# Patient Record
Sex: Female | Born: 1975 | Race: Black or African American | Hispanic: No | Marital: Single | Smoking: Former smoker
Health system: Southern US, Community
[De-identification: ages and names within clinical notes are randomized; demographics above are authoritative.]

## PROBLEM LIST (undated history)

## (undated) DIAGNOSIS — R519 Headache, unspecified: Secondary | ICD-10-CM

## (undated) DIAGNOSIS — B977 Papillomavirus as the cause of diseases classified elsewhere: Secondary | ICD-10-CM

## (undated) DIAGNOSIS — J309 Allergic rhinitis, unspecified: Secondary | ICD-10-CM

## (undated) DIAGNOSIS — B1920 Unspecified viral hepatitis C without hepatic coma: Secondary | ICD-10-CM

## (undated) DIAGNOSIS — E7800 Pure hypercholesterolemia, unspecified: Secondary | ICD-10-CM

## (undated) DIAGNOSIS — R51 Headache: Secondary | ICD-10-CM

## (undated) HISTORY — DX: Headache, unspecified: R51.9

## (undated) HISTORY — DX: Headache: R51

## (undated) HISTORY — DX: Pure hypercholesterolemia, unspecified: E78.00

## (undated) HISTORY — DX: Papillomavirus as the cause of diseases classified elsewhere: B97.7

## (undated) HISTORY — DX: Unspecified viral hepatitis C without hepatic coma: B19.20

## (undated) HISTORY — DX: Allergic rhinitis, unspecified: J30.9

---
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
## 1997-09-16 ENCOUNTER — Emergency Department (HOSPITAL_COMMUNITY): Admission: EM | Admit: 1997-09-16 | Discharge: 1997-09-16 | Payer: Self-pay | Admitting: Emergency Medicine

## 1999-05-05 ENCOUNTER — Other Ambulatory Visit: Admission: RE | Admit: 1999-05-05 | Discharge: 1999-05-05 | Payer: Self-pay | Admitting: Obstetrics and Gynecology

## 1999-06-20 ENCOUNTER — Inpatient Hospital Stay (HOSPITAL_COMMUNITY): Admission: AD | Admit: 1999-06-20 | Discharge: 1999-06-20 | Payer: Self-pay | Admitting: *Deleted

## 1999-10-31 ENCOUNTER — Inpatient Hospital Stay (HOSPITAL_COMMUNITY): Admission: AD | Admit: 1999-10-31 | Discharge: 1999-11-02 | Payer: Self-pay | Admitting: *Deleted

## 2004-01-28 ENCOUNTER — Other Ambulatory Visit: Admission: RE | Admit: 2004-01-28 | Discharge: 2004-01-28 | Payer: Self-pay | Admitting: Family Medicine

## 2004-07-13 ENCOUNTER — Inpatient Hospital Stay (HOSPITAL_COMMUNITY): Admission: AD | Admit: 2004-07-13 | Discharge: 2004-07-13 | Payer: Self-pay | Admitting: Obstetrics & Gynecology

## 2004-08-10 ENCOUNTER — Other Ambulatory Visit: Admission: RE | Admit: 2004-08-10 | Discharge: 2004-08-10 | Payer: Self-pay | Admitting: Obstetrics and Gynecology

## 2005-03-09 ENCOUNTER — Inpatient Hospital Stay (HOSPITAL_COMMUNITY): Admission: AD | Admit: 2005-03-09 | Discharge: 2005-03-12 | Payer: Self-pay | Admitting: Obstetrics and Gynecology

## 2006-09-29 ENCOUNTER — Other Ambulatory Visit: Admission: RE | Admit: 2006-09-29 | Discharge: 2006-09-29 | Payer: Self-pay | Admitting: Family Medicine

## 2008-07-21 ENCOUNTER — Other Ambulatory Visit: Admission: RE | Admit: 2008-07-21 | Discharge: 2008-07-21 | Payer: Self-pay | Admitting: Family Medicine

## 2008-12-29 ENCOUNTER — Emergency Department (HOSPITAL_COMMUNITY): Admission: EM | Admit: 2008-12-29 | Discharge: 2008-12-29 | Payer: Self-pay | Admitting: Emergency Medicine

## 2009-12-31 ENCOUNTER — Other Ambulatory Visit: Admission: RE | Admit: 2009-12-31 | Discharge: 2009-12-31 | Payer: Self-pay | Admitting: Obstetrics and Gynecology

## 2010-08-27 NOTE — Op Note (Signed)
Robin Arnold, Robin Arnold             ACCOUNT NO.:  [id number]   MEDICAL RECORD NO.:  [id number]          PATIENT TYPE:  INP   LOCATION:  9147                          FACILITY:  [facility]   PHYSICIAN:  Crist Fat. Rivard, M.D. DATE OF BIRTH:  06/07/1975   DATE OF PROCEDURE:  03/10/2005  DATE OF DISCHARGE:                                 OPERATIVE REPORT   PREOPERATIVE DIAGNOSES:  1.  Intrauterine pregnancy at 41 weeks 4 days.  2.  Failure to progress.   POSTOPERATIVE DIAGNOSES:  1.  Intrauterine pregnancy at 41 weeks 4 days.  2.  Failure to progress.   ANESTHESIA:  Epidural.   PROCEDURE:  Primary low transverse cesarean section.   SURGEON:  Crist Fat. Rivard, M.D.   ASSISTANTRenaldo Reel. Latham, C.N.M.   ESTIMATED BLOOD LOSS:  1000 mL.   PROCEDURE:  After being informed of the planned procedure with possible  complications including bleeding, infection, injury to bowel, bladder or  ureters, informed consent is obtained.  The patient is taken to OR #1 and  preexisting epidural anesthesia is reinforced.  She is placed in a dorsal  decubitus position, pelvis tilted to the left, prepped and draped in a  sterile fashion, and a Foley catheter is inserted in her bladder.  After  assessing adequate level of anesthesia, we infiltrate the suprapubic area  with 20 mL of Marcaine 0.25% and perform a Pfannenstiel incision, which was  brought down sharply to the fascia.  The fascia is incised in a low  transverse fashion.  Linea alba is dissected.  Peritoneum is entered in a  midline fashion.  Visceral peritoneum is entered in a low transverse  fashion, allowing [place] to safely retract bladder by developing a bladder flap.  Myometrium is entered in a low transverse fashion first with knife, then  extended bluntly.  Amniotic fluid is minimal but clear.  We assist the birth  of a female infant in left OT at 10:08 p.m.  Mouth and nose are suctioned.  Baby is delivered.  Cord is clamped with two Kelly  clamps and sectioned.  Baby is given to the pediatrician present in the room.  The patient received  Ancef 1 g, IV 20 mL of blood is drawn from the umbilical vein, and the  placenta is allowed to deliver spontaneously.  It is complete and the cord  has three vessels.  Uterine revision is negative.   We proceed with closure of the myometrium in two layers, first with a  running locked suture of 0 Vicryl, then with a Lembert suture of 0 Vicryl  imbricating the first one.  Hemostasis is completed on the left angle with  three figure-of-eight stitches of 0 Vicryl and midline with a figure-of-  eight stitch of 0 Vicryl.  Hemostasis is then deemed adequate.  Both  paracolic gutters are cleaned, both tubes and ovaries assessed and normal.  Pelvis is irrigated with warm saline.  Hemostasis is rechecked and adequate.  During this process, pain felt by the patient is improved by irrigating the  pelvis with 10 mL of  Nesacaine 3%.  Under fascia hemostasis is completed  with cautery and the fascia is closed with two running sutures of #1 Vicryl  meeting midline.  Hemostasis of the incision is completed with cautery and  skin is closed with a subcuticular suture of 3-0 Monocryl and Steri-Strips.   Instrument and sponge count is complete x2.  Estimated blood loss is minimal  is 1000 mL.  The procedure is well-tolerated by the patient, who is taken to  recovery room in a well and stable condition.   A little boy named Robin Arnold was born at 10:08 p.m., received an Apgar of 8 at  one minute and 9 at five minutes and weighed 8 pounds 13 ounces.      Crist Fat Rivard, M.D.  Electronically Signed     SAR/MEDQ  D:  03/10/2005  T:  03/11/2005  Job:  [id number]

## 2010-08-27 NOTE — H&P (Signed)
NAMEKAELIE, HENIGAN             ACCOUNT NO.:  [id number]   MEDICAL RECORD NO.:  [id number]          PATIENT TYPE:  INP   LOCATION:  9170                          [facility]ACILITY:  [facility]   PHYSICIAN:  Cam Hai, C.N.M.  DATE OF BIRTH:  03-31-1976   DATE OF ADMISSION:  03/09/2005  DATE OF DISCHARGE:                                HISTORY & PHYSICAL   Robin Arnold is a 35 year old single black female, gravida 4, para 1-0-2-1,  at 41-3/7 weeks who presents for induction of labor secondary to post dates.  She denies signs and symptoms of PIH.  She denies leaking, bleeding, reports  rate contractions.  Her pregnancy has been followed by the [facility] certified nurse midwife service and has been remarkable for:  (1)  Obesity.  (2) Previous smoker.  (3) Left fetal pyelectasis.  (4) Group B  Strep negative.  Her prenatal labs were collected on Aug 10, 2004.  Platelets  276,000.  Blood type B positive, antibody negative, sickle cell trait  negative, RPR nonreactive, rubella immune, hepatitis B surface antigen  negative, HIV negative, Pap smear within normal limits, gonorrhea negative,  Chlamydia positive, HSV1 positive, HSV2 negative, cystic fibrosis negative.  On September 22, 2004, gonorrhea and Chlamydia negative.  1-hour Glucola on September 24, 2004, was 121, and RPR was nonreactive at that time.  Glucola on  December 16, 2004, was 123.  Culture of the vaginal tract for Group B Strep,  gonorrhea, and Chlamydia on January 27, 2005, were all negative.   HISTORY OF PRESENT PREGNANCY:  The patient presented for care at [facility] on Aug 10, 2004, at 11-2/[redacted] weeks gestation.  She was diagnosed  with positive Chlamydia at that time and given 1 gram of Zithromax  approximately mid-May.  Her test of cure mid-June was negative.  She was  given an 18-week Glucola due to her increased BMI.  Ultrasonography on September 24, 2004, shows growth consistent with previous dating confirming [facility] of  February 27, 2005.   Ultrasonography at 35-4/[redacted] weeks gestation shows vertex presentation.  Estimated fetal weight at the 66th percentile with normal fluid.  Also shows  left fetal pyelectasis at 0.9 cm.  The rest of her prenatal care has been  unremarkable.   PAST OBSTETRICAL HISTORY:  She is a gravida 4, para 1-0-2-1.  In July of  1997 she had an EAB at 10 weeks.  In July of 2001 she had a vaginal delivery  of a female infant weighing 7 pounds 5 ounces at [redacted] weeks gestation after 14  hours of labor.  She had no anesthesia.  Infant's name was Amia.  She had no  complications with that pregnancy or birth.  In August of 2002 she had an  EAB at [redacted] weeks gestation.   ALLERGIES:  No known drug allergies.   PAST MEDICAL HISTORY:  She experienced menarche at the age of 77 with 28-day  cycles lasting 3-5 days.  She has used condoms and abstinence in the past  for contraception.  She was treated for gonorrhea and Chlamydia more than 5  years ago as well as Chlamydia in her first trimester of this pregnancy.  She reports hepatitis C from 2002.   PAST SURGICAL HISTORY:  Remarkable for hernia repair at the age of 1.   FAMILY HISTORY:  Remarkable for a maternal grandmother with heart disease.  Mother and maternal grandmother with hypertension.  Maternal grandmother  with varicosities.  Mother with anemia.  Mother with history of seizures.  Maternal grandmother with CVA.  Maternal grandmother with stomach cancer.   GENETIC HISTORY:  Remarkable for father of the baby's nephew with cerebral  palsy.   SOCIAL HISTORY:  The patient is single.  The father of the baby is not  involved.  She is of the [place] faith.  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  She denies any alcohol, tobacco, or illicit drug use since her  positive pregnancy test.   OBJECTIVE:  VITAL SIGNS:  Stable, she is afebrile.  HEENT:  Grossly within normal limits.  CHEST:  Clear to auscultation.  HEART:  Regular rate and  rhythm.  ABDOMEN:  Gravid in contour with the fundal height extending approximately  40 cm above the pubic symphysis.  Fetal heart rate is reassuring.  Positive  scalp stimulation.  Occasional mild variables.  She is having occasional  mild contractions.  PELVIC:  Cervix is posterior, 2+ cm, 50% effaced, vertex -2.  EXTREMITIES:  Normal.   ASSESSMENT:  1.  Intrauterine pregnancy at term.  2.  Unfavorable cervix.  3.  Group B Strep negative.   PLAN:  1.  Admit to birthing suite.  2.  Routine C.N.M. orders.  3.  Plan Cervidil tonight for ripening and in the morning will decide upon      artificial rupture of membranes versus Pitocin.      Cam Hai, C.N.M.     KS/MEDQ  D:  03/09/2005  T:  03/10/2005  Job:  [id number]

## 2010-08-27 NOTE — Discharge Summary (Signed)
NAMEALLEX, MADIA             ACCOUNT NO.:  [id number]   MEDICAL RECORD NO.:  [id number]          PATIENT TYPE:  INP   LOCATION:  9147                          [facility]ACILITY:  [facility]   PHYSICIAN:  Osborn Coho, M.D.   DATE OF BIRTH:  Nov 07, 1975   DATE OF ADMISSION:  03/09/2005  DATE OF DISCHARGE:                                 DISCHARGE SUMMARY   ADMITTING DIAGNOSES:  1.  Intrauterine pregnancy at term.  2.  Induction of labor secondary to post-date.   DISCHARGE DIAGNOSES:  1.  Intrauterine pregnancy at term delivered.  2.  Primary low-transverse cesarean section.   PROCEDURE:  Primary low-transverse cesarean section.   HOSPITAL COURSE:  Ms. Moorefield is a 35 year old gravida 4, para 1-0-2-1, who  was admitted at 9 and 3/[redacted] weeks gestation for induction of labor secondary  to post-date.  The patient's pregnancy remarkable for:  1.  Increased BMI.  2.  Previous tobacco use.  3.  Left fetal pyelectasis.  4.  Group B strep negative.   The patient progressed to complete dilation and pushed for approximately 2  1/2 hours without decent of the fetal vertex.  Due to failure to descend, it  was recommended patient undergo primary low-transverse cesarean section.  The patient agreed and underwent a primary low-transverse cesarean section.  She delivered a viable female infant, Inez Pilgrim, with weight 8 pounds, 13 ounces.  Apgar's of 8 and 9.  Surgery was uncomplicated.  Estimated blood loss 1000  mL.  Circumcision was deferred.  The patient breastfeeding.  Hemoglobin was  8.6 on day one and day two postpartum.  Platelets were 141,000 on admission,  then decreased to 127,000 with platelets at 144,000 at the time of  discharge.  By postpartum day #2, the patient requesting discharge.  The  patient was ambulating, voiding, and tolerating p.o. liquids and solids  without difficulty.  The patient was deemed to have received full benefit of  her hospital stay.  The patient was discharged home.   DISCHARGE CONDITION:  Stable.   DISCHARGE INSTRUCTIONS:  Per [facility] handout.   DISCHARGE MEDICATIONS:  1.  Motrin 600 mg p.o. q. 6 h. p.r.n. pain.  2.  Tylox one to two tablets p.o. q.4-6 h. p.r.n. pain.  3.  Tandem F one tablet p.o. daily, which patient finally has a prescription      for.  4.  Prenatal vitamins one tab daily.   DISCHARGE FOLLOW UP:  At six weeks postpartum at [facility].      Rhona Leavens, CNM      Osborn Coho, M.D.  Electronically Signed    NOS/MEDQ  D:  03/12/2005  T:  03/12/2005  Job:  [id number]

## 2011-01-24 ENCOUNTER — Emergency Department (HOSPITAL_COMMUNITY): Payer: No Typology Code available for payment source

## 2011-01-24 ENCOUNTER — Emergency Department (HOSPITAL_COMMUNITY)
Admission: EM | Admit: 2011-01-24 | Discharge: 2011-01-24 | Disposition: A | Discharge status: Home / Self Care | Payer: No Typology Code available for payment source | Attending: Emergency Medicine | Admitting: Emergency Medicine

## 2011-01-24 DIAGNOSIS — M545 Low back pain, unspecified: Secondary | ICD-10-CM | POA: Insufficient documentation

## 2011-01-24 DIAGNOSIS — M25529 Pain in unspecified elbow: Secondary | ICD-10-CM | POA: Insufficient documentation

## 2012-12-26 ENCOUNTER — Other Ambulatory Visit: Payer: Self-pay | Admitting: Obstetrics and Gynecology

## 2012-12-26 ENCOUNTER — Other Ambulatory Visit (HOSPITAL_COMMUNITY)
Admission: RE | Admit: 2012-12-26 | Discharge: 2012-12-26 | Disposition: A | Discharge status: Home / Self Care | Payer: Medicaid Other | Source: Ambulatory Visit | Attending: Obstetrics and Gynecology | Admitting: Obstetrics and Gynecology

## 2012-12-26 DIAGNOSIS — Z01419 Encounter for gynecological examination (general) (routine) without abnormal findings: Secondary | ICD-10-CM | POA: Insufficient documentation

## 2012-12-26 DIAGNOSIS — Z113 Encounter for screening for infections with a predominantly sexual mode of transmission: Secondary | ICD-10-CM | POA: Insufficient documentation

## 2012-12-26 DIAGNOSIS — Z1151 Encounter for screening for human papillomavirus (HPV): Secondary | ICD-10-CM | POA: Insufficient documentation

## 2013-03-12 ENCOUNTER — Other Ambulatory Visit: Payer: Self-pay | Admitting: Family Medicine

## 2013-03-12 DIAGNOSIS — R112 Nausea with vomiting, unspecified: Secondary | ICD-10-CM

## 2013-03-12 DIAGNOSIS — R109 Unspecified abdominal pain: Secondary | ICD-10-CM

## 2013-03-13 ENCOUNTER — Ambulatory Visit
Admission: RE | Admit: 2013-03-13 | Discharge: 2013-03-13 | Disposition: A | Discharge status: Home / Self Care | Payer: Medicaid Other | Source: Ambulatory Visit | Attending: Family Medicine | Admitting: Family Medicine

## 2013-03-13 DIAGNOSIS — R109 Unspecified abdominal pain: Secondary | ICD-10-CM

## 2013-03-13 DIAGNOSIS — R112 Nausea with vomiting, unspecified: Secondary | ICD-10-CM

## 2013-03-13 MED ORDER — IOHEXOL 300 MG/ML  SOLN
125.0000 mL | Freq: Once | INTRAMUSCULAR | Status: AC | PRN
  Administered 2013-03-13: 125 mL via INTRAVENOUS

## 2014-06-09 IMAGING — CT CT ABD-PELV W/ CM
3 of 4 series · 13 of 36 positions shown, 19 images · IV contrast (READICAT/WATER & [ID] OMNI 300)
Comparison: None.

CLINICAL DATA: Abdominal pain, cramping, nausea and vomiting

EXAM:
CT ABDOMEN AND PELVIS WITH CONTRAST
TECHNIQUE: Multidetector CT imaging of the abdomen and pelvis was performed
using the standard protocol following bolus administration of
intravenous contrast.
CONTRAST:  125mL OMNIPAQUE IOHEXOL 300 MG/ML  SOLN

[Series 3: abd/pelvis with · axial · 0.96mm/px · z∈[-306,+29]mm · 7 of 88 slices shown, 12 images]
[im 11/88  soft-tissue]
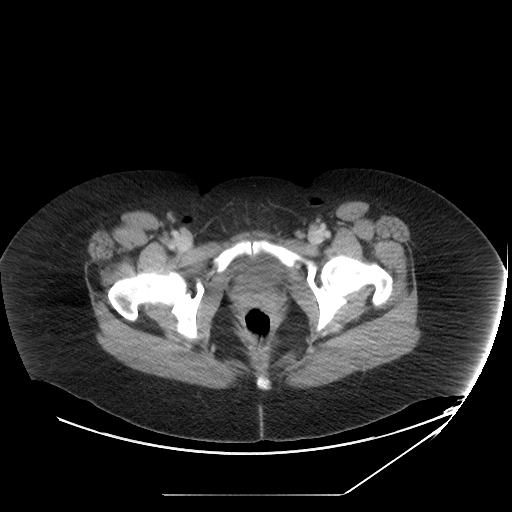
[im 11/88  bone]
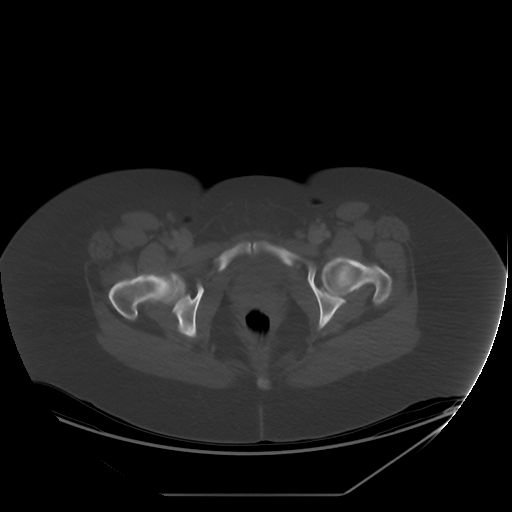
[im 22/88  soft-tissue]
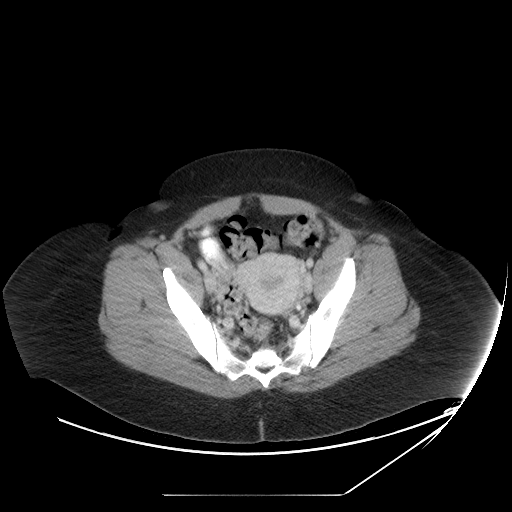
[im 33/88  soft-tissue]
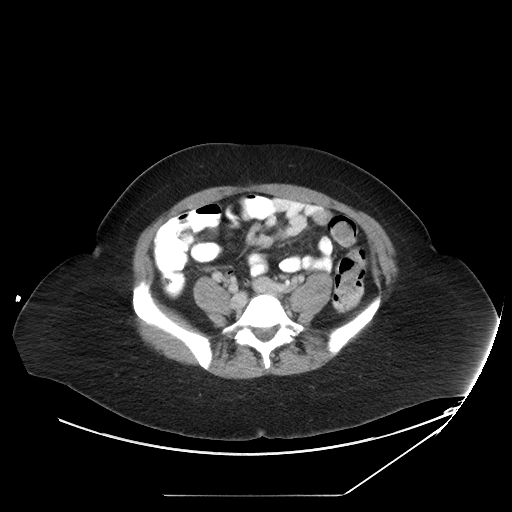
[im 44/88  soft-tissue]
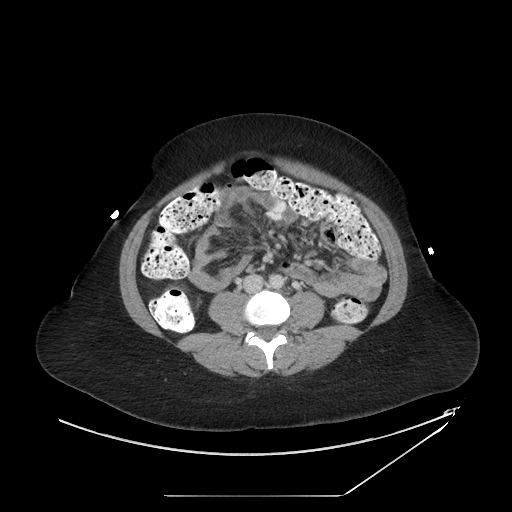
[im 44/88  lung]
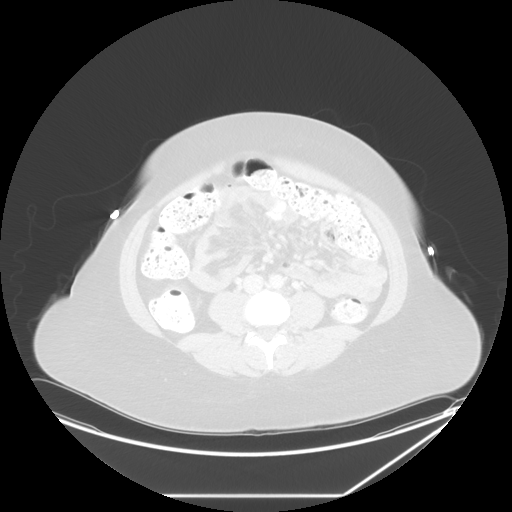
[im 55/88  soft-tissue]
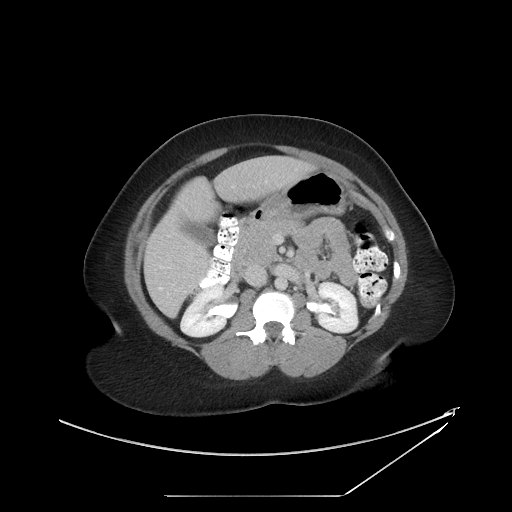
[im 55/88  lung]
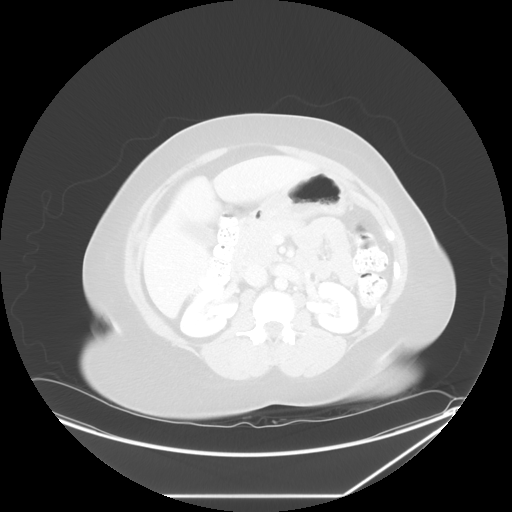
[im 66/88  soft-tissue]
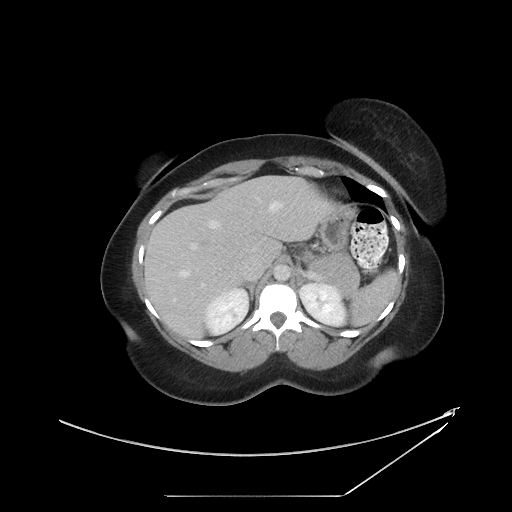
[im 66/88  lung]
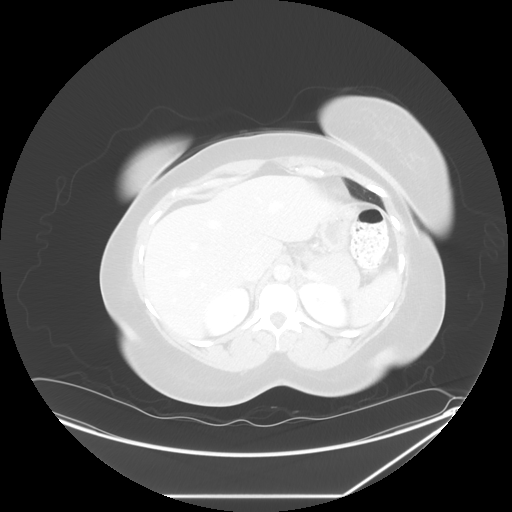
[im 77/88  soft-tissue]
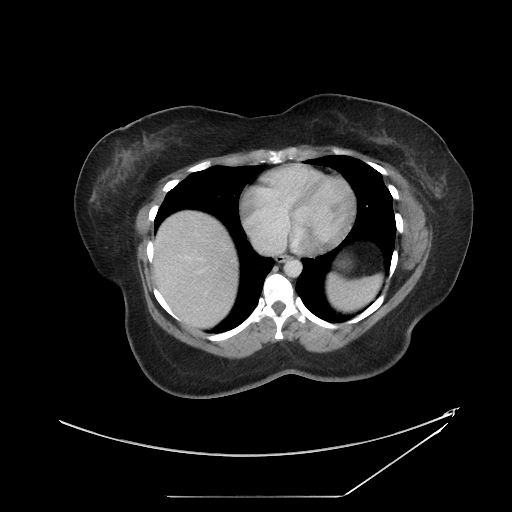
[im 77/88  lung]
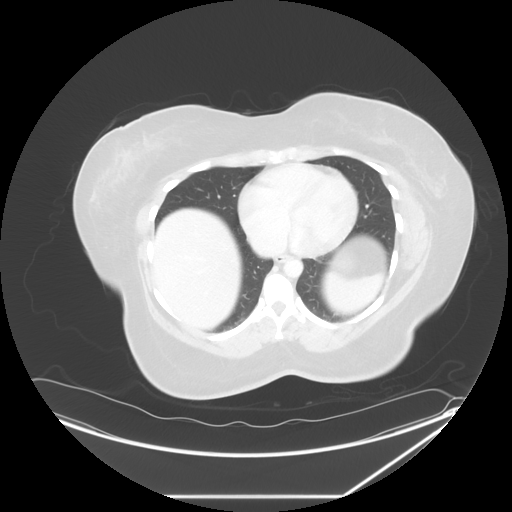

[Series 601: coronal body · coronal · 0.96mm/px · 1 of 139 slices shown, 2 images]
[im 47/139  soft-tissue]
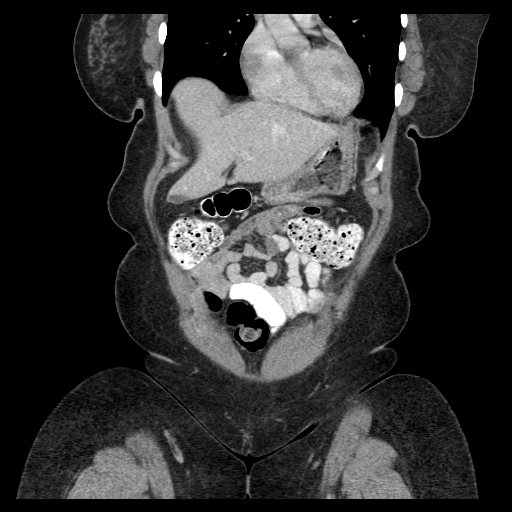
[im 47/139  bone]
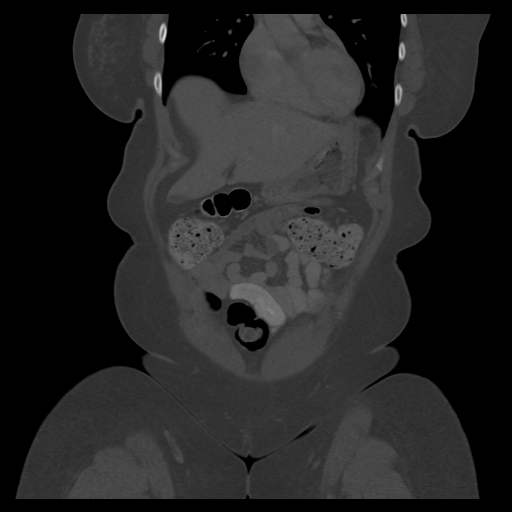

[Series 602: sagittal body · sagittal · 0.96mm/px · 5 of 196 slices shown]
[im 21/196  soft-tissue]
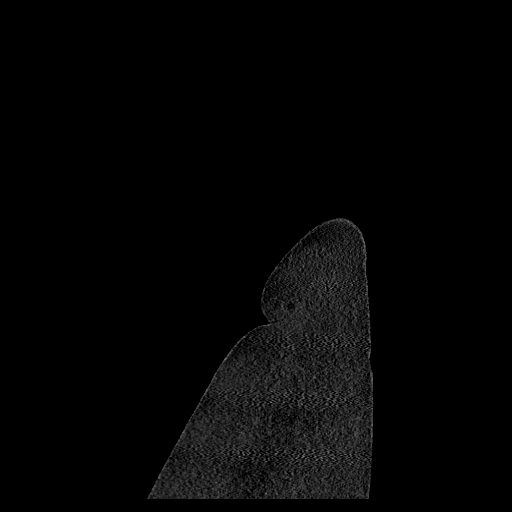
[im 42/196  soft-tissue]
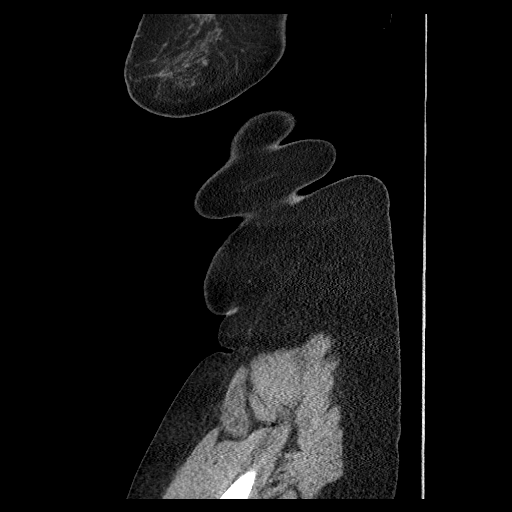
[im 62/196  soft-tissue]
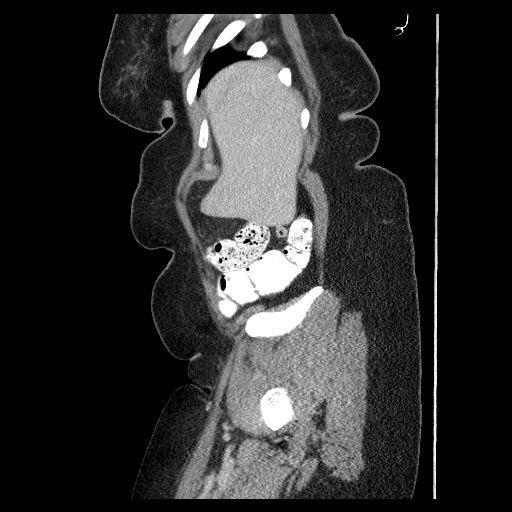
[im 83/196  soft-tissue]
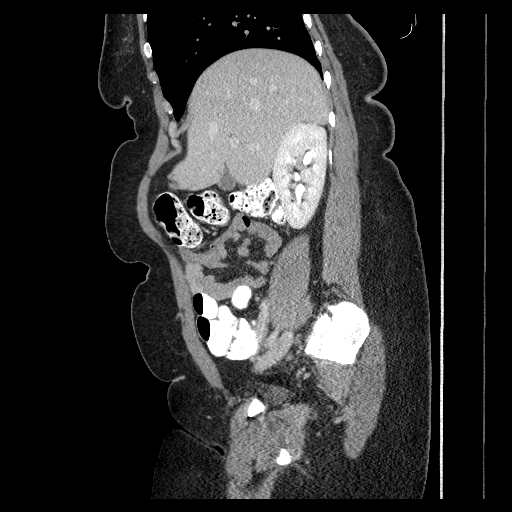
[im 113/196  soft-tissue]
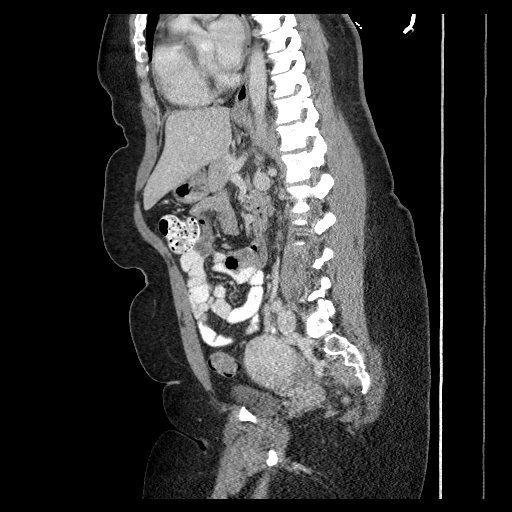

[13 of 36 positions shown; findings below may reference images not displayed]

FINDINGS: The lung bases are clear. The liver enhances with no focal
abnormality and no ductal dilatation is seen. No calcified
gallstones are. The pancreas is normal in size and the pancreatic
duct is not dilated. The adrenal glands and spleen are unremarkable.
The stomach is decompressed and cannot be evaluated. The kidneys
enhance with no evidence of calculus or mass although the
pelvocaliceal systems are opacified with contrast on the initial
images. The pelvocaliceal systems are unremarkable and the proximal
ureters are normal in caliber. The abdominal aorta is normal in
caliber. No adenopathy is seen.

The uterus is normal in size. Small ovarian follicles are noted
bilaterally. No free fluid is seen within the pelvis. The urinary
bladder is unremarkable. There is no evidence of diverticulitis and
no significant diverticular formation is noted. There is feces
throughout the colon. The terminal ileum is well opacified with
contrast and is unremarkable. The appendix also appears normal.
There is degenerative disc disease at L5-S1 where there is also a
slight retrolisthesis of L5 on S1 by approximately 4 mm.
IMPRESSION: 1. No explanation for the patient's abdominal pain is seen. There is
no evidence of diverticulitis.
2. The appendix and terminal ileum appear normal.
3. Degenerative disc disease at L5-S1.

## 2014-11-09 ENCOUNTER — Emergency Department (HOSPITAL_COMMUNITY)
Admission: EM | Admit: 2014-11-09 | Discharge: 2014-11-09 | Disposition: A | Discharge status: Home / Self Care | Payer: Medicaid Other | Source: Home / Self Care

## 2014-11-09 ENCOUNTER — Encounter (HOSPITAL_COMMUNITY): Payer: Self-pay | Admitting: Emergency Medicine

## 2014-11-09 DIAGNOSIS — M25522 Pain in left elbow: Secondary | ICD-10-CM | POA: Diagnosis not present

## 2014-11-09 DIAGNOSIS — M779 Enthesopathy, unspecified: Secondary | ICD-10-CM

## 2014-11-09 DIAGNOSIS — IMO0002 Reserved for concepts with insufficient information to code with codable children: Secondary | ICD-10-CM

## 2014-11-09 MED ORDER — TRAMADOL HCL 50 MG PO TABS: 50.0000 mg | ORAL_TABLET | Freq: Four times a day (QID) | ORAL | Status: DC | PRN

## 2014-11-09 NOTE — Discharge Instructions (Signed)
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
## 2014-11-09 NOTE — ED Notes (Signed)
Patient reports left elbow pain, shooting pain with and without movement of joint. This started after left elbow hyperextended.  Prior to this patient reports having scrubbed skin "exfoliated" and felt like a burn since doing this in may.

## 2017-04-16 ENCOUNTER — Ambulatory Visit (HOSPITAL_COMMUNITY)
Admission: EM | Admit: 2017-04-16 | Discharge: 2017-04-16 | Disposition: A | Discharge status: Home / Self Care | Payer: Medicaid Other | Attending: Physician Assistant | Admitting: Physician Assistant

## 2017-04-16 ENCOUNTER — Other Ambulatory Visit: Payer: Self-pay

## 2017-04-16 DIAGNOSIS — R82998 Other abnormal findings in urine: Secondary | ICD-10-CM

## 2017-04-16 DIAGNOSIS — Z113 Encounter for screening for infections with a predominantly sexual mode of transmission: Secondary | ICD-10-CM | POA: Diagnosis not present

## 2017-04-16 DIAGNOSIS — Z202 Contact with and (suspected) exposure to infections with a predominantly sexual mode of transmission: Secondary | ICD-10-CM | POA: Insufficient documentation

## 2017-04-16 DIAGNOSIS — N898 Other specified noninflammatory disorders of vagina: Secondary | ICD-10-CM

## 2017-04-16 DIAGNOSIS — N760 Acute vaginitis: Secondary | ICD-10-CM

## 2017-04-16 DIAGNOSIS — F17200 Nicotine dependence, unspecified, uncomplicated: Secondary | ICD-10-CM | POA: Insufficient documentation

## 2017-04-16 DIAGNOSIS — R109 Unspecified abdominal pain: Secondary | ICD-10-CM

## 2017-04-16 LAB — POCT URINALYSIS DIP (DEVICE)
BILIRUBIN URINE: NEGATIVE
Glucose, UA: NEGATIVE mg/dL
KETONES UR: NEGATIVE mg/dL
NITRITE: NEGATIVE
PH: 5.5 (ref 5.0–8.0)
Protein, ur: NEGATIVE mg/dL
Specific Gravity, Urine: 1.025 (ref 1.005–1.030)
Urobilinogen, UA: 0.2 mg/dL (ref 0.0–1.0)

## 2017-04-16 NOTE — ED Triage Notes (Signed)
Vaginal infection, per pt she is having itching and burning, per pt upper pelvic pain, vaginal smells,

## 2017-04-16 NOTE — ED Notes (Signed)
Call back number verified and updated in EPIC... Adv pt to not have SI until lab results comeback neg.... Also adv pt lab results will be on MyChart; instructions given .... Pt verb understanding.   
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
## 2017-04-16 NOTE — Discharge Instructions (Signed)
Given that you are not having any persistent symptoms its best to wait for testing to result.

## 2017-04-17 LAB — CERVICOVAGINAL ANCILLARY ONLY
BACTERIAL VAGINITIS: NEGATIVE
Candida vaginitis: NEGATIVE
Chlamydia: NEGATIVE
Neisseria Gonorrhea: NEGATIVE
Trichomonas: NEGATIVE

## 2017-04-17 LAB — URINE CULTURE

## 2017-04-17 LAB — RPR: RPR: NONREACTIVE

## 2017-04-17 LAB — HIV ANTIBODY (ROUTINE TESTING W REFLEX): HIV SCREEN 4TH GENERATION: NONREACTIVE

## 2017-05-25 ENCOUNTER — Other Ambulatory Visit: Payer: Self-pay | Admitting: Obstetrics & Gynecology

## 2017-05-25 DIAGNOSIS — Z139 Encounter for screening, unspecified: Secondary | ICD-10-CM

## 2017-05-29 ENCOUNTER — Ambulatory Visit: Payer: Medicaid Other

## 2017-06-13 ENCOUNTER — Ambulatory Visit
Admission: RE | Admit: 2017-06-13 | Discharge: 2017-06-13 | Disposition: A | Discharge status: Home / Self Care | Payer: Medicaid Other | Source: Ambulatory Visit | Attending: Obstetrics & Gynecology | Admitting: Obstetrics & Gynecology

## 2017-06-13 DIAGNOSIS — Z139 Encounter for screening, unspecified: Secondary | ICD-10-CM

## 2017-06-28 ENCOUNTER — Other Ambulatory Visit: Payer: Self-pay | Admitting: Family Medicine

## 2017-06-28 DIAGNOSIS — R319 Hematuria, unspecified: Secondary | ICD-10-CM

## 2017-06-28 DIAGNOSIS — R109 Unspecified abdominal pain: Secondary | ICD-10-CM

## 2017-07-08 ENCOUNTER — Ambulatory Visit
Admission: RE | Admit: 2017-07-08 | Discharge: 2017-07-08 | Disposition: A | Discharge status: Home / Self Care | Payer: Medicaid Other | Source: Ambulatory Visit | Attending: Family Medicine | Admitting: Family Medicine

## 2017-07-08 DIAGNOSIS — R319 Hematuria, unspecified: Secondary | ICD-10-CM

## 2017-07-08 DIAGNOSIS — R109 Unspecified abdominal pain: Secondary | ICD-10-CM

## 2017-07-11 ENCOUNTER — Encounter: Payer: Self-pay | Admitting: Neurology

## 2017-07-11 ENCOUNTER — Telehealth: Payer: Self-pay | Admitting: Neurology

## 2017-07-11 ENCOUNTER — Encounter: Payer: Self-pay | Admitting: *Deleted

## 2017-07-11 ENCOUNTER — Ambulatory Visit: Payer: Medicaid Other | Admitting: Neurology

## 2017-07-11 VITALS — BP 125/86 | HR 70 | Ht 61.0 in | Wt 212.0 lb

## 2017-07-11 DIAGNOSIS — R2990 Unspecified symptoms and signs involving the nervous system: Secondary | ICD-10-CM | POA: Diagnosis not present

## 2017-07-11 DIAGNOSIS — R519 Headache, unspecified: Secondary | ICD-10-CM

## 2017-07-11 DIAGNOSIS — R202 Paresthesia of skin: Secondary | ICD-10-CM

## 2017-07-11 DIAGNOSIS — R51 Headache with orthostatic component, not elsewhere classified: Secondary | ICD-10-CM

## 2017-07-11 DIAGNOSIS — R339 Retention of urine, unspecified: Secondary | ICD-10-CM

## 2017-07-11 DIAGNOSIS — K599 Functional intestinal disorder, unspecified: Secondary | ICD-10-CM

## 2017-07-11 DIAGNOSIS — R200 Anesthesia of skin: Secondary | ICD-10-CM

## 2017-07-11 DIAGNOSIS — G629 Polyneuropathy, unspecified: Secondary | ICD-10-CM | POA: Diagnosis not present

## 2017-07-11 DIAGNOSIS — H539 Unspecified visual disturbance: Secondary | ICD-10-CM | POA: Diagnosis not present

## 2017-07-11 NOTE — Patient Instructions (Addendum)
MRI of the brain and neck. If negative will order MRi thoracic spine. EMG/NCS of left arm and leg Sleep evaluation for obstructive sleep apnea.

## 2017-07-11 NOTE — Telephone Encounter (Signed)
Medicaid order sent to GI they obtain the auth and will reach out to pt to schedule.

## 2017-07-11 NOTE — Progress Notes (Signed)
[facility]    Provider:  Dr Jaynee Eagles Referring Provider: Antony Contras, MD Primary Care Physician:  Antony Contras, MD  CC:  Paresthesias  HPI:  Robin Arnold is a 42 y.o. female here as a referral from Dr. Moreen Fowler for paresthesias.  Past medical history hepatitis C, hyperlipidemia, vascular headache, situational anxiety, obesity, paresthesias. In October she started having tightness in the thoracic paraspinal areas, tight and achy, bilateral, sharp pains. She started having fatigue, frequent urination, nausea, felt tight around her urethra, but not burning or frequency just a tight feeling, she also started with constipation, she has urine retension feels like she has to go but can't go also bowel retension unable to defecate. She wet to eagle, had bloodwork done, metabolic panel and a urine test and she was given antibiotics however her egfr had fallen but was still normal making her wonder about her kidneys. In December she may have felt better but in January the symptoms worsened. She went to urgent care and had a workup. She saw her primary doctor in February and she started feeling better again. In march it all worsened. She also experiences numbness in the limbs moreso at night and it would wake her up, arms and legs as well as her hands. Her feet get cold and she gets numb. She takes ibuprofen every day for years and has a daily headache. She snores heavily, she wakes herself up sometimes snoring, she wakes up with headache, very tired during the day.  She has vision changes as well. No other focal neurologic deficits, associated symptoms, inciting events or modifiable factors.  Reviewed notes, labs and imaging from outside physicians, which showed:   CT abdomen/spine 07/09/2017: IMPRESSION: 1. No acute intra-abdominal or pelvic pathology. No hydronephrosis or nephrolithiasis. 2. Moderate colonic stool burden. No bowel obstruction or active inflammation. Normal  appendix. 3. Scattered sigmoid diverticula without active inflammatory Changes.  BMP collection date May 26, 2018 19th was unremarkable including creatinine 0.88 and BUN of 11.  CBC was unremarkable as well.  TSH normal November 2016.  HSV negative.  HCV not detected.  RPR negative.  LDL 89.  Reviewed referring physician notes.  Patient has ongoing flank pain.  The pain comes and goes but never fully resolves.  This is been ongoing since October 2018 and worsening since March of this year.  Worse with movement.  Also associated with a twinge in the urethra.  She has had cold numbness tingling buzzing in her hands, feet, legs, arms has been going on for a long time but worse in the past week.  She is not resting because she gets up a lot to urinate and the numbness is disturbing her sleep.  There are lots of family stressors.  Exam appeared unremarkable.  A  Review of Systems: Patient complains of symptoms per HPI as well as the following symptoms: Blurred vision, memory loss, headache, numbness, weakness, difficulty swallowing, dizziness, tremor, insomnia, palpitations, feeling hot, increased thirst, joint pain, ecchymosis, constipation, hearing loss, spinning sensation, trouble swallowing. Pertinent negatives and positives per HPI. All others negative.   Social History   Socioeconomic History  . Marital status: Single    Spouse name: Not on file  . Number of children: 2  . Years of education: Not on file  . Highest education level: Master's degree (e.g., MA, MS, MEng, MEd, MSW, MBA)  Occupational History  . Not on file  Social Needs  . Financial resource strain: Not on file  . Food  insecurity:    Worry: Not on file    Inability: Not on file  . Transportation needs:    Medical: Not on file    Non-medical: Not on file  Tobacco Use  . Smoking status: Former Smoker    Last attempt to quit: 2016    Years since quitting: 3.2  . Smokeless tobacco: Never Used  Substance and Sexual  Activity  . Alcohol use: Not Currently    Comment: none since 2016  . Drug use: Not Currently  . Sexual activity: Not on file  Lifestyle  . Physical activity:    Days per week: Not on file    Minutes per session: Not on file  . Stress: Not on file  Relationships  . Social connections:    Talks on phone: Not on file    Gets together: Not on file    Attends religious service: Not on file    Active member of club or organization: Not on file    Attends meetings of clubs or organizations: Not on file    Relationship status: Not on file  . Intimate partner violence:    Fear of current or ex partner: Not on file    Emotionally abused: Not on file    Physically abused: Not on file    Forced sexual activity: Not on file  Other Topics Concern  . Not on file  Social History Narrative   Lives at home with her 2 children   Right handed   Drinks 1 cup of caffeine daily    Family History  Problem Relation Age of Onset  . Breast cancer Maternal Aunt 62  . Breast cancer Paternal Grandmother        unsure of age  . Breast cancer Cousin 6    Past Medical History:  Diagnosis Date  . Allergic rhinitis   . Headache   . Hepatitis C    did not require treatment  . High cholesterol   . HPV in female     Past Surgical History:  Procedure Laterality Date  . CESAREAN SECTION  2006  . HERNIA REPAIR  1977    Current Outpatient Medications  Medication Sig Dispense Refill  . cyclobenzaprine (FLEXERIL) 10 MG tablet Take 10 mg by mouth at bedtime as needed.    Marland Kitchen ibuprofen (ADVIL,MOTRIN) 200 MG tablet Take 200-800 mg by mouth daily.    . Multiple Vitamin (MULTIVITAMIN) tablet Take 1 tablet by mouth daily.    . naproxen (NAPROSYN) 500 MG tablet Take 500 mg by mouth 2 (two) times daily as needed.     No current facility-administered medications for this visit.     Allergies as of 07/11/2017 - Review Complete 07/11/2017  Allergen Reaction Noted  . Allegra-d [fexofenadine-pseudoephed  er]  07/11/2017  . Sulfa antibiotics  04/16/2017  . Vicodin [hydrocodone-acetaminophen] Itching 07/11/2017  . Codeine Palpitations 07/11/2017  . Septra ds [sulfamethoxazole-trimethoprim] Rash 07/11/2017    Vitals: BP 125/86 (BP Location: Right Arm, Patient Position: Sitting)   Pulse 70   Ht '5\' 1"'$  (1.549 m)   Wt 212 lb (96.2 kg)   LMP 06/12/2017 (Exact Date)   BMI 40.06 kg/m  Last Weight:  Wt Readings from Last 1 Encounters:  07/11/17 212 lb (96.2 kg)   Last Height:   Ht Readings from Last 1 Encounters:  07/11/17 '5\' 1"'$  (1.549 m)   Physical exam: Exam: Gen: NAD, conversant, well nourised, obese, well groomed  CV: RRR, no MRG. No Carotid Bruits. No peripheral edema, warm, nontender Eyes: Conjunctivae clear without exudates or hemorrhage  Neuro: Detailed Neurologic Exam  Speech:    Speech is normal; fluent and spontaneous with normal comprehension.  Cognition:    The patient is oriented to person, place, and time;     recent and remote memory intact;     language fluent;     normal attention, concentration,     fund of knowledge Cranial Nerves:    The pupils are equal, round, and reactive to light. The fundi are normal and spontaneous venous pulsations are present. Visual fields are full to finger confrontation. Extraocular movements are intact. Trigeminal sensation is intact and the muscles of mastication are normal. The face is symmetric. The palate elevates in the midline. Hearing intact. Voice is normal. Shoulder shrug is normal. The tongue has normal motion without fasciculations.   Coordination:    Normal finger to nose and heel to shin. Normal rapid alternating movements.   Gait:    Heel-toe and tandem gait are normal.   Motor Observation:    No asymmetry, no atrophy, and no involuntary movements noted. Tone:    Normal muscle tone.    Posture:    Posture is normal. normal erect    Strength:    Strength is V/V in the upper and lower  limbs.      Sensation: intact to LT     Reflex Exam:  DTR's:    Absent AJs otherwise Deep tendon reflexes in the upper and lower extremities are hyporeflexic bilaterally.   Toes:    The toes are downgoing bilaterally.   Clonus:    Clonus is absent.     Assessment/Plan:  Patient with multiple neurologic complaints and symptoms  Emg/ncs: left hand and left leg  MRI brain and cervical spine w/wo contrast to evaluate for MS given concerning symptoms of bowel and bladder retention, limb numbness, daily positional headache, fatigue, numbness in the face.   Ibuprofen overuse: She has daily headaches due to medication overuse, also discussed other side effects of overuse of ibuprofen. Recommend cessation  Sleep eval: She snores heavily, she wakes herself up sometimes snoring, she wakes up with headache, very tired during the day. But ESS only 7, consider in the future.    Orders Placed This Encounter  Procedures  . MR BRAIN W WO CONTRAST  . MR CERVICAL SPINE W WO CONTRAST  . Hemoglobin A1c  . Sjogren's syndrome antibods(ssa + ssb)  . ANA w/Reflex  . B12 and Folate Panel  . Rheumatoid factor  . Heavy metals, blood  . Vitamin B6  . Cryoglobulin  . Vitamin B1  . Basic Metabolic Panel  . Methylmalonic acid, serum  . NCV with EMG(electromyography)    Sarina Ill, MD  [facility] [address] [place], [place] [address]  Phone [phone] Fax [phone]

## 2017-07-15 ENCOUNTER — Ambulatory Visit
Admission: RE | Admit: 2017-07-15 | Discharge: 2017-07-15 | Disposition: A | Discharge status: Home / Self Care | Payer: Medicaid Other | Source: Ambulatory Visit | Attending: Neurology | Admitting: Neurology

## 2017-07-15 DIAGNOSIS — R202 Paresthesia of skin: Secondary | ICD-10-CM

## 2017-07-15 DIAGNOSIS — G629 Polyneuropathy, unspecified: Secondary | ICD-10-CM

## 2017-07-15 DIAGNOSIS — R51 Headache with orthostatic component, not elsewhere classified: Secondary | ICD-10-CM

## 2017-07-15 DIAGNOSIS — K599 Functional intestinal disorder, unspecified: Secondary | ICD-10-CM

## 2017-07-15 DIAGNOSIS — R200 Anesthesia of skin: Secondary | ICD-10-CM

## 2017-07-15 DIAGNOSIS — H539 Unspecified visual disturbance: Secondary | ICD-10-CM

## 2017-07-15 DIAGNOSIS — R339 Retention of urine, unspecified: Secondary | ICD-10-CM

## 2017-07-15 DIAGNOSIS — R519 Headache, unspecified: Secondary | ICD-10-CM

## 2017-07-15 DIAGNOSIS — R2990 Unspecified symptoms and signs involving the nervous system: Secondary | ICD-10-CM

## 2017-07-15 MED ORDER — GADOBENATE DIMEGLUMINE 529 MG/ML IV SOLN
20.0000 mL | Freq: Once | INTRAVENOUS | Status: AC | PRN
  Administered 2017-07-15: 20 mL via INTRAVENOUS

## 2017-07-17 ENCOUNTER — Telehealth: Payer: Self-pay | Admitting: *Deleted

## 2017-07-17 LAB — BASIC METABOLIC PANEL
BUN/Creatinine Ratio: 18 (ref 9–23)
BUN: 14 mg/dL (ref 6–24)
CALCIUM: 9.1 mg/dL (ref 8.7–10.2)
CO2: 21 mmol/L (ref 20–29)
CREATININE: 0.8 mg/dL (ref 0.57–1.00)
Chloride: 106 mmol/L (ref 96–106)
GFR calc Af Amer: 104 mL/min/{1.73_m2} (ref >59)
GFR calc non Af Amer: 91 mL/min/{1.73_m2} (ref >59)
GLUCOSE: 92 mg/dL (ref 65–99)
Potassium: 4.4 mmol/L (ref 3.5–5.2)
SODIUM: 141 mmol/L (ref 134–144)

## 2017-07-17 LAB — HEMOGLOBIN A1C
Est. average glucose Bld gHb Est-mCnc: 114 mg/dL
Hgb A1c MFr Bld: 5.6 % (ref 4.8–5.6)

## 2017-07-17 LAB — HEAVY METALS, BLOOD
Arsenic: 12 ug/L (ref 2–23)
Lead, Blood: NOT DETECTED ug/dL (ref 0–4)
Mercury: NOT DETECTED ug/L (ref 0.0–14.9)

## 2017-07-17 LAB — B12 AND FOLATE PANEL
FOLATE: 6 ng/mL (ref >3.0)
VITAMIN B 12: 354 pg/mL (ref 232–1245)

## 2017-07-17 LAB — VITAMIN B1: Thiamine: 51.7 nmol/L — ABNORMAL LOW (ref 66.5–200.0)

## 2017-07-17 LAB — SJOGREN'S SYNDROME ANTIBODS(SSA + SSB): ENA SSA (RO) Ab: <0.2 AI (ref 0.0–0.9)

## 2017-07-17 LAB — CRYOGLOBULIN

## 2017-07-17 LAB — VITAMIN B6: Vitamin B6: 13.3 ug/L (ref 2.0–32.8)

## 2017-07-17 LAB — METHYLMALONIC ACID, SERUM: METHYLMALONIC ACID: 100 nmol/L (ref 0–378)

## 2017-07-17 LAB — RHEUMATOID FACTOR

## 2017-07-17 LAB — ANA W/REFLEX: ANA: NEGATIVE

## 2017-07-17 NOTE — Telephone Encounter (Signed)
Spoke with patient and discussed the following: Patient has a low thiamine level. All other labs unremarkable. This can cause many problems such as numbness, tingling, muscle weakness, memory loss, confusion. Would recommend she takes daily B1(Thiamine) supplements such as 100mg  daily. She should also examine her diet and possibly take a multivitamin with a variety of vitamins with other B vitamins in it. She can buy these over the counter. A great web site to read is the following: https://ods.ArchitectReviews.com.auod.nih.gov/factsheets/Thiamin-Consumer Also discussed with the patient that her MRI brain is normal and her MRI cervical spine has some mild/minimal arthritis changes that are normal for her age. No multiple sclerosis, no strokes, no masses - no cause for her symptoms seen.   The patient verbalized understanding of all and had no questions.

## 2017-07-17 NOTE — Telephone Encounter (Signed)
-----   Message from Anson FretAntonia B Ahern, MD sent at 07/16/2017  4:30 PM EDT ----- Patient has a low thiamine level. All other labs unremarkable. This can cause many problems such as numbness, tingling, muscle weakness, memory loss, confusion. Would recommend she takes daily B1(Thiamine) supplements such as 100mg  daily. She should also examine her diet and possibly take a multivitamin with a variety of vitamins with other B vitamins in it. She can buy these over the counter. A great web site to read is the following: https://ods.LostMillions.com.ptod.nih.gov/factsheets/Thiamin-Consumer/ thanks

## 2017-08-10 ENCOUNTER — Ambulatory Visit: Payer: Medicaid Other | Admitting: Neurology

## 2017-08-10 ENCOUNTER — Ambulatory Visit (INDEPENDENT_AMBULATORY_CARE_PROVIDER_SITE_OTHER): Payer: Medicaid Other | Admitting: Neurology

## 2017-08-10 DIAGNOSIS — Z0289 Encounter for other administrative examinations: Secondary | ICD-10-CM

## 2017-08-10 DIAGNOSIS — G629 Polyneuropathy, unspecified: Secondary | ICD-10-CM

## 2017-08-10 DIAGNOSIS — R202 Paresthesia of skin: Secondary | ICD-10-CM

## 2017-08-10 DIAGNOSIS — R2990 Unspecified symptoms and signs involving the nervous system: Secondary | ICD-10-CM | POA: Diagnosis not present

## 2017-08-10 DIAGNOSIS — E519 Thiamine deficiency, unspecified: Secondary | ICD-10-CM | POA: Diagnosis not present

## 2017-08-10 NOTE — Progress Notes (Signed)
See procedure note.

## 2017-08-10 NOTE — Progress Notes (Signed)
Discussed B1 deficiency, reviewed the following literature with patient:  https://ods.CreditQuarterly.pl  Reviewed MRI of the brain and cervical spine images with patient which were unremarkable. Reviewed normal emg/ncs results.   She feels improved on the B1 supplement will follow clinically. Continue, discussed diet and exercise.  A total of 15 minutes was spent face-to-face with this patient. Over half this time was spent on counseling patient on the multiple neurologic symptoms diagnosis and different diagnostic and therapeutic options, risk factor reduction and education.  This does not include any time spent on emg/ncs procedure.

## 2017-08-13 DIAGNOSIS — E519 Thiamine deficiency, unspecified: Secondary | ICD-10-CM | POA: Insufficient documentation

## 2017-08-13 NOTE — Procedures (Signed)
      Full Name: Robin Arnold Gender: Female MRN #: [id number] Date of Birth: 07/16/1975    Visit Date: 08/10/17 08:22 Age: 41 Years 7 Months Old Examining Physician: Antonia Ahern, MD  Referring Physician: Ahern, MD  History:  Patient with multiple neurologic complaints and symptoms. 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 MRI brain and cervical spine unremarkable.   Summary: EMG/NCS was perform on the left upper and lower extremities. All nerves and muscles were normal.    Conclusion: This is a normal study   Antonia Ahern M.D.  [facility] [address] [place], [place] [address] Tel: [phone] Fax: [phone]        MNC    Nerve / Sites Muscle Latency Ref. Amplitude Ref. Rel Amp Segments Distance Velocity Ref. Area    ms ms mV mV %  cm m/s m/s mVms  L Median - APB     Wrist APB 3.0 ?4.4 10.2 ?4.0 100 Wrist - APB 7   34.0     Upper arm APB 6.3  9.8  96.7 Upper arm - Wrist 18 56 ?49 33.4  L Ulnar - ADM     Wrist ADM 2.5 ?3.3 10.7 ?6.0 100 Wrist - ADM 7   27.5     B.Elbow ADM 5.4  10.6  99.5 B.Elbow - Wrist 18 62 ?49 27.3     A.Elbow ADM 6.7  10.8  101 A.Elbow - B.Elbow 8 64 ?49 27.6         A.Elbow - Wrist      L Peroneal - EDB     Ankle EDB 3.1 ?6.5 4.2 ?2.0 100 Ankle - EDB 9   13.8     Fib head EDB 8.6  4.1  96.1 Fib head - Ankle 29 53 ?44 13.4     Pop fossa EDB 10.0  4.0  99.4 Pop fossa - Fib head 8 57 ?44 12.9         Pop fossa - Ankle      L Tibial - AH     Ankle AH 3.9 ?5.8 15.8 ?4.0 100 Ankle - AH 9   32.0     Pop fossa AH 10.5  12.1  76.3 Pop fossa - Ankle 33 50 ?41 28.5             SNC    Nerve / Sites Rec. Site Peak Lat Ref.  Amp Ref. Segments Distance Peak Diff Ref.    ms ms V V  cm ms ms  L Sural - Ankle (Calf)     Calf Ankle 3.1 ?4.4 30 ?6 Calf - Ankle 14    L Superficial peroneal - Ankle     Lat leg Ankle 3.5 ?4.4 12 ?6 Lat leg - Ankle 14    L Median, Ulnar - Transcarpal comparison      Median Palm Wrist 2.1 ?2.2 33 ?35 Median Palm - Wrist 8       Ulnar Palm Wrist 1.8 ?2.2 15 ?12 Ulnar Palm - Wrist 8          Median Palm - Ulnar Palm  0.3 ?0.4  L Median - Orthodromic (Dig II, Mid palm)     Dig II Wrist 2.8 ?3.4 10 ?10 Dig II - Wrist 13    L Ulnar - Orthodromic, (Dig V, Mid palm)     Dig V Wrist 2.3 ?3.1 12 ?5 Dig V - Wrist 11                   F  Wave    Nerve F Lat Ref.   ms ms  L Ulnar - ADM 24.8 ?32.0  L Tibial - AH 40.7 ?56.0         EMG full       EMG Summary Table    Spontaneous MUAP Recruitment  Muscle IA Fib PSW Fasc Other Amp Dur. Poly Pattern  L. Deltoid Normal None None None _______ Normal Normal Normal Normal  L. Triceps brachii Normal None None None _______ Normal Normal Normal Normal  L. Pronator teres Normal None None None _______ Normal Normal Normal Normal  L. First dorsal interosseous Normal None None None _______ Normal Normal Normal Normal  L. Opponens pollicis Normal None None None _______ Normal Normal Normal Normal  L. Vastus medialis Normal None None None _______ Normal Normal Normal Normal  L. Tibialis anterior Normal None None None _______ Normal Normal Normal Normal  L. Gastrocnemius (Medial head) Normal None None None _______ Normal Normal Normal Normal  L. Extensor hallucis longus Normal None None None _______ Normal Normal Normal Normal  L. Abductor digiti minimi (pedis) Normal None None None _______ Normal Normal Normal Normal

## 2017-08-13 NOTE — Progress Notes (Signed)
Full Name: Addysin Porco Gender: Female MRN #: [id number] Date of Birth: Jul 11, 1975    Visit Date: 08/10/17 08:22 Age: 42 Years 7 Months Old Examining Physician: Naomie Dean, MD  Referring Physician: Lucia Gaskins, MD  History:  Patient with multiple neurologic complaints and symptoms. 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 MRI brain and cervical spine unremarkable.   Summary: EMG/NCS was perform on the left upper and lower extremities. All nerves and muscles were normal.    Conclusion: This is a normal study   Naomie Dean M.D.  [facility] [address] [place], [place] [address] Tel: [phone] Fax: [phone]        [facility]    Nerve / Sites Muscle Latency Ref. Amplitude Ref. Rel Amp Segments Distance Velocity Ref. Area    ms ms mV mV %  cm m/s m/s mVms  L Median - APB     Wrist APB 3.0 ?4.4 10.2 ?4.0 100 Wrist - APB 7   34.0     Upper arm APB 6.3  9.8  96.7 Upper arm - Wrist 18 56 ?49 33.4  L Ulnar - ADM     Wrist ADM 2.5 ?3.3 10.7 ?6.0 100 Wrist - ADM 7   27.5     B.Elbow ADM 5.4  10.6  99.5 B.Elbow - Wrist 18 62 ?49 27.3     A.Elbow ADM 6.7  10.8  101 A.Elbow - B.Elbow 8 64 ?49 27.6         A.Elbow - Wrist      L Peroneal - EDB     Ankle EDB 3.1 ?6.5 4.2 ?2.0 100 Ankle - EDB 9   13.8     Fib head EDB 8.6  4.1  96.1 Fib head - Ankle 29 53 ?44 13.4     Pop fossa EDB 10.0  4.0  99.4 Pop fossa - Fib head 8 57 ?44 12.9         Pop fossa - Ankle      L Tibial - AH     Ankle AH 3.9 ?5.8 15.8 ?4.0 100 Ankle - AH 9   32.0     Pop fossa AH 10.5  12.1  76.3 Pop fossa - Ankle 33 50 ?41 28.5             SNC    Nerve / Sites Rec. Site Peak Lat Ref.  Amp Ref. Segments Distance Peak Diff Ref.    ms ms V V  cm ms ms  L Sural - Ankle (Calf)     Calf Ankle 3.1 ?4.4 30 ?6 Calf - Ankle 14    L Superficial peroneal - Ankle     Lat leg Ankle 3.5 ?4.4 12 ?6 Lat leg - Ankle 14    L Median, Ulnar - Transcarpal comparison      Median Palm Wrist 2.1 ?2.2 33 ?35 Median Palm - Wrist 8       Ulnar Palm Wrist 1.8 ?2.2 15 ?12 Ulnar Palm - Wrist 8          Median Palm - Ulnar Palm  0.3 ?0.4  L Median - Orthodromic (Dig II, Mid palm)     Dig II Wrist 2.8 ?3.4 10 ?10 Dig II - Wrist 13    L Ulnar - Orthodromic, (Dig V, Mid palm)     Dig V Wrist 2.3 ?3.1 12 ?5 Dig V - Wrist 11  F  Wave    Nerve F Lat Ref.   ms ms  L Ulnar - ADM 24.8 ?32.0  L Tibial - AH 40.7 ?56.0         EMG full       EMG Summary Table    Spontaneous MUAP Recruitment  Muscle IA Fib PSW Fasc Other Amp Dur. Poly Pattern  L. Deltoid Normal None None None _______ Normal Normal Normal Normal  L. Triceps brachii Normal None None None _______ Normal Normal Normal Normal  L. Pronator teres Normal None None None _______ Normal Normal Normal Normal  L. First dorsal interosseous Normal None None None _______ Normal Normal Normal Normal  L. Opponens pollicis Normal None None None _______ Normal Normal Normal Normal  L. Vastus medialis Normal None None None _______ Normal Normal Normal Normal  L. Tibialis anterior Normal None None None _______ Normal Normal Normal Normal  L. Gastrocnemius (Medial head) Normal None None None _______ Normal Normal Normal Normal  L. Extensor hallucis longus Normal None None None _______ Normal Normal Normal Normal  L. Abductor digiti minimi (pedis) Normal None None None _______ Normal Normal Normal Normal

## 2018-10-04 IMAGING — CT CT ABD-PELV W/O CM
1 of 2 series · 14 of 32 positions shown, 19 images · non-contrast
Comparison: Abdominal CT dated 03/13/2013

CLINICAL DATA: 41-year-old female with bilateral flank pain x6
months. Evaluate for renal calculi.

EXAM:
CT ABDOMEN AND PELVIS WITHOUT CONTRAST
TECHNIQUE: Multidetector CT imaging of the abdomen and pelvis was performed
following the standard protocol without IV contrast.

[Series 2: renal standard/full · axial · 0.87mm/px · z∈[-430,-40]mm · 14 of 88 slices shown, 19 images]
[im 5/88  soft-tissue]
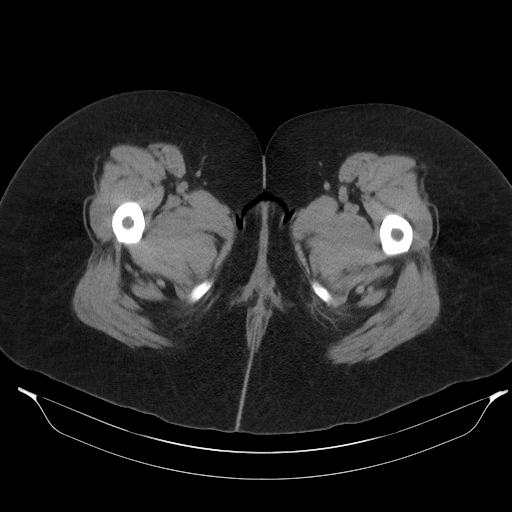
[im 5/88  bone]
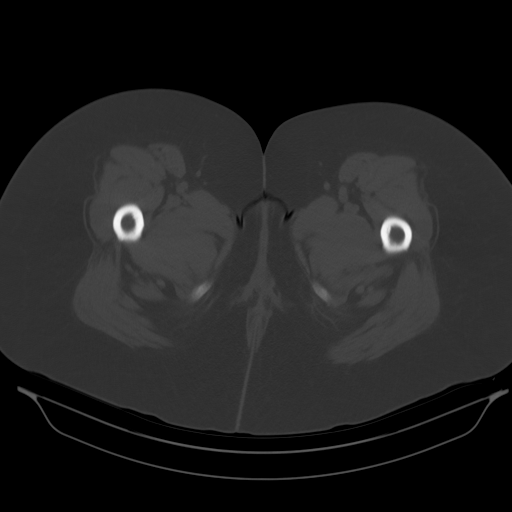
[im 13/88  soft-tissue]
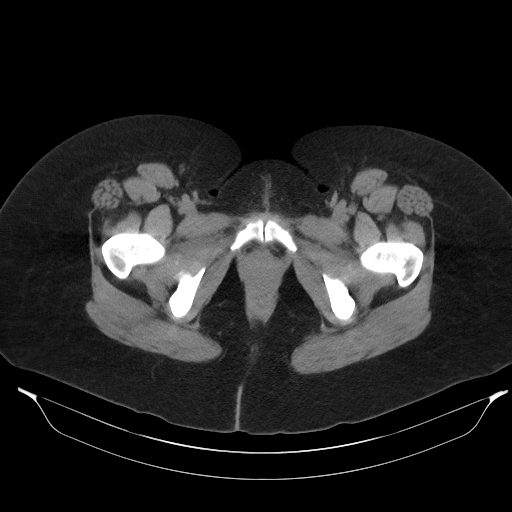
[im 17/88  soft-tissue]
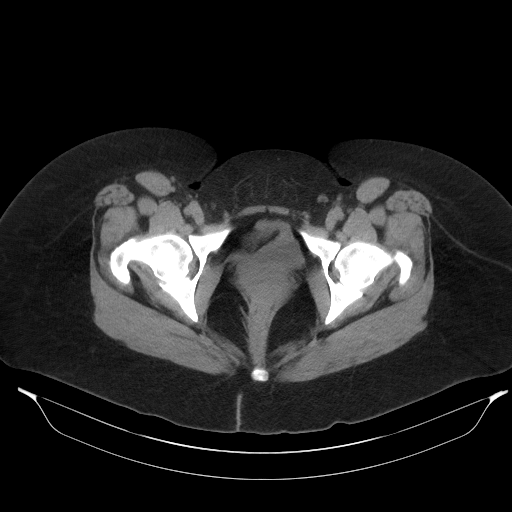
[im 25/88  soft-tissue]
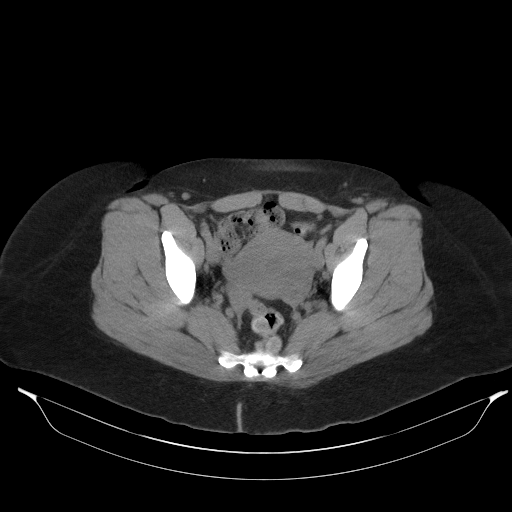
[im 30/88  soft-tissue]
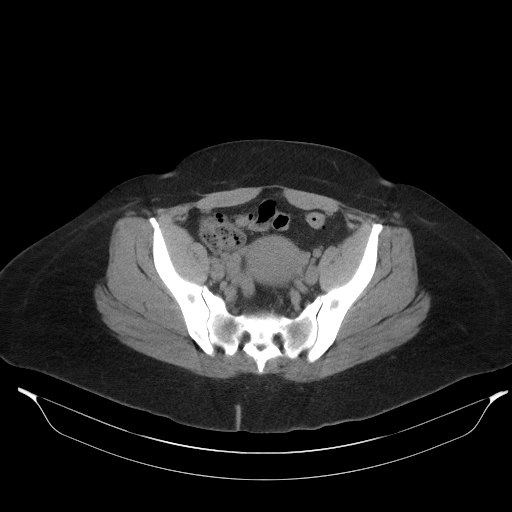
[im 38/88  soft-tissue]
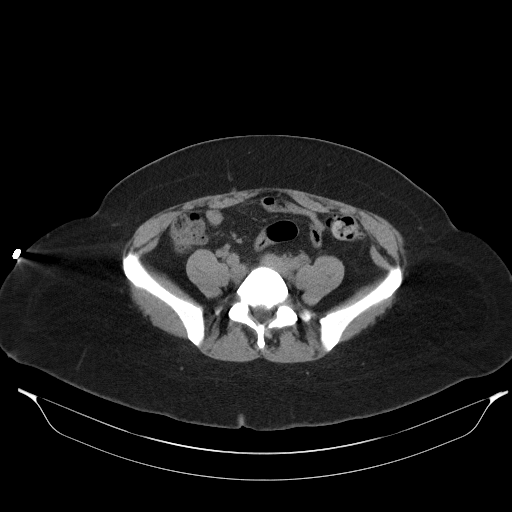
[im 46/88  soft-tissue]
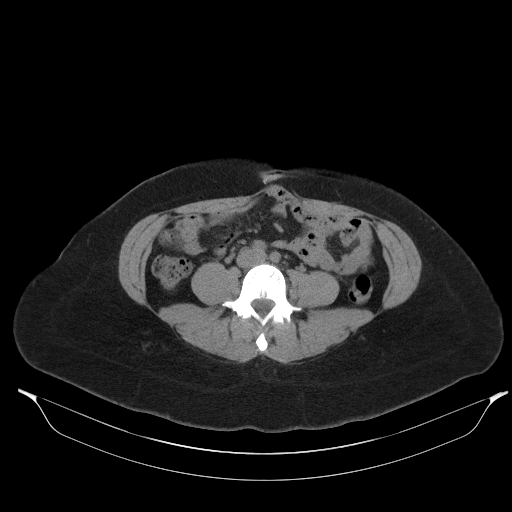
[im 50/88  soft-tissue]
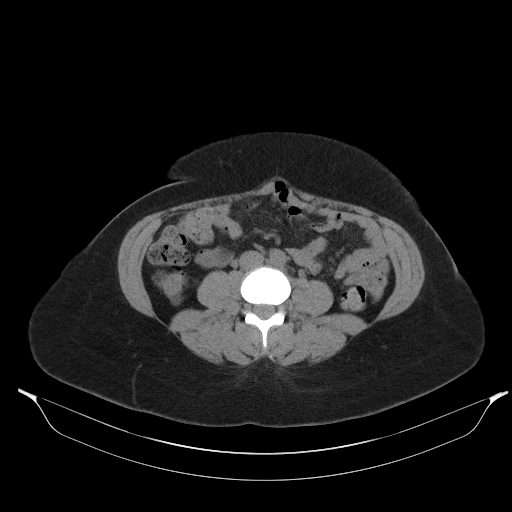
[im 59/88  soft-tissue]
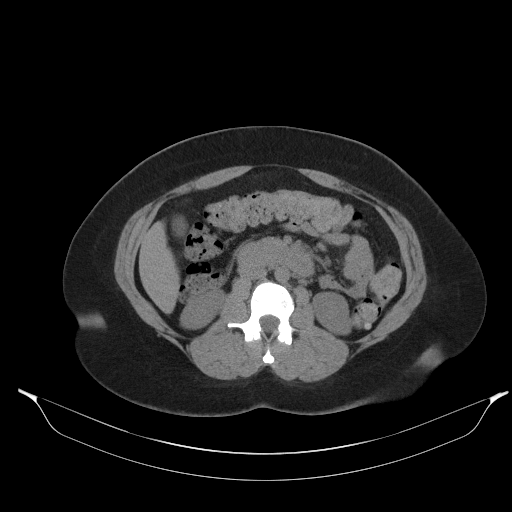
[im 59/88  bone]
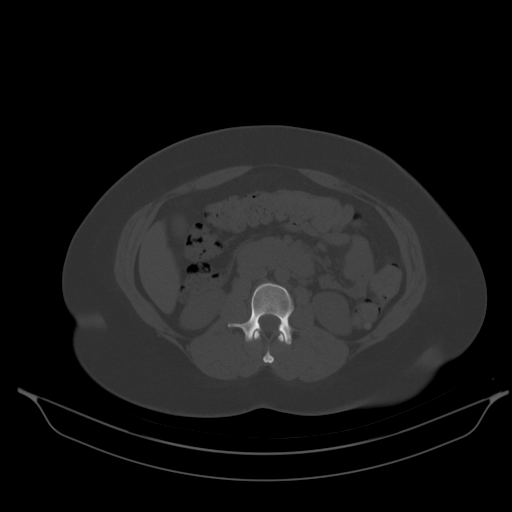
[im 63/88  soft-tissue]
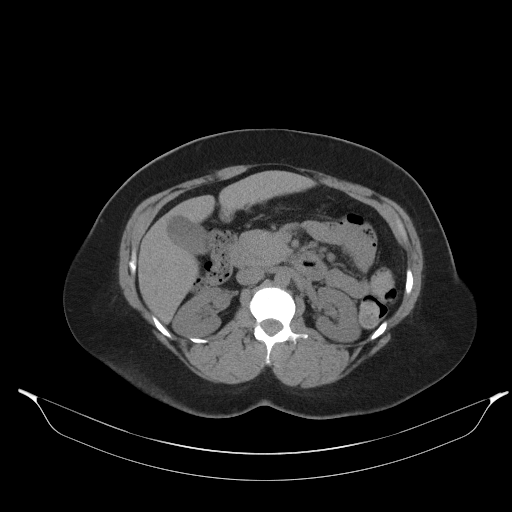
[im 71/88  soft-tissue]
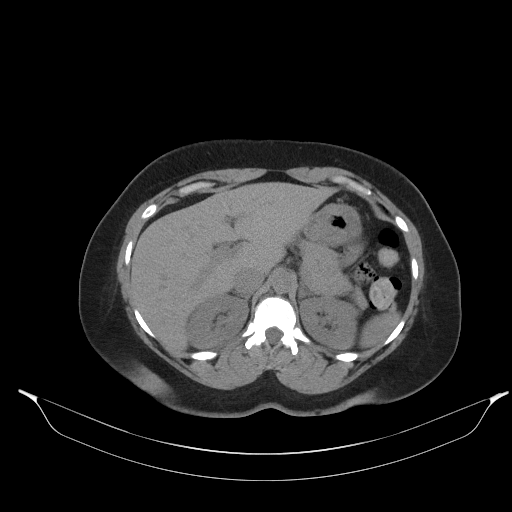
[im 71/88  lung]
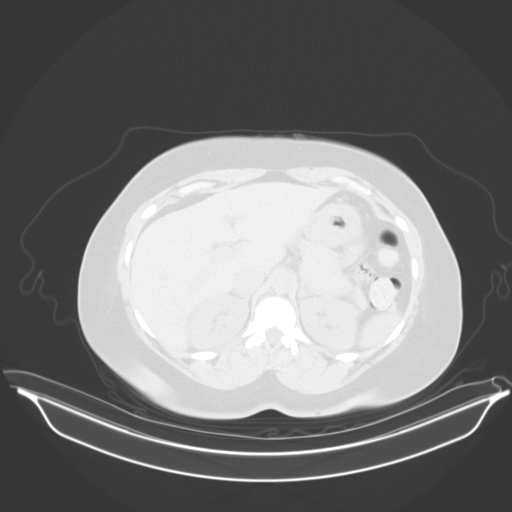
[im 75/88  soft-tissue]
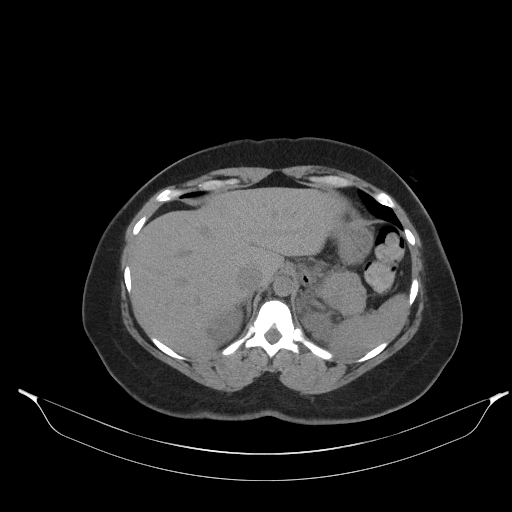
[im 75/88  lung]
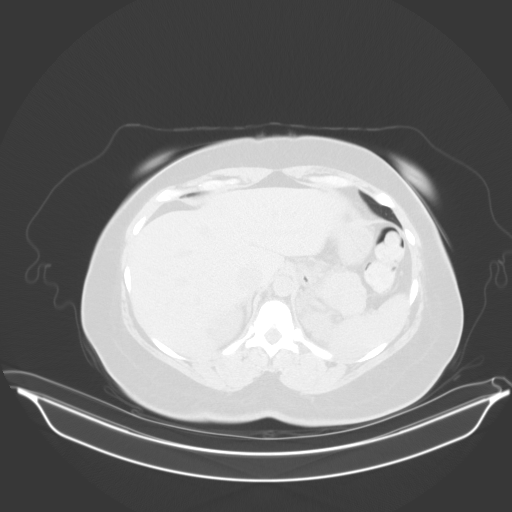
[im 79/88  lung]
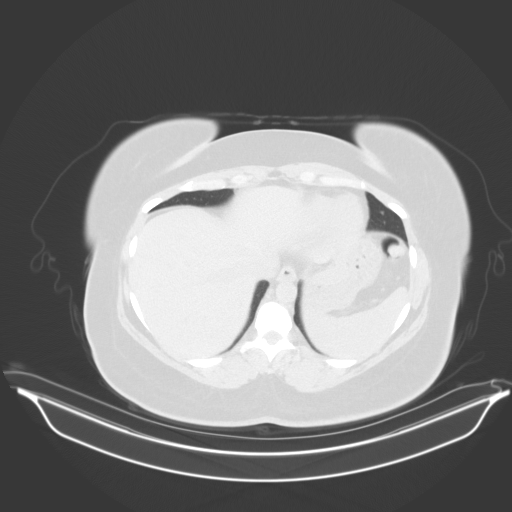
[im 83/88  soft-tissue]
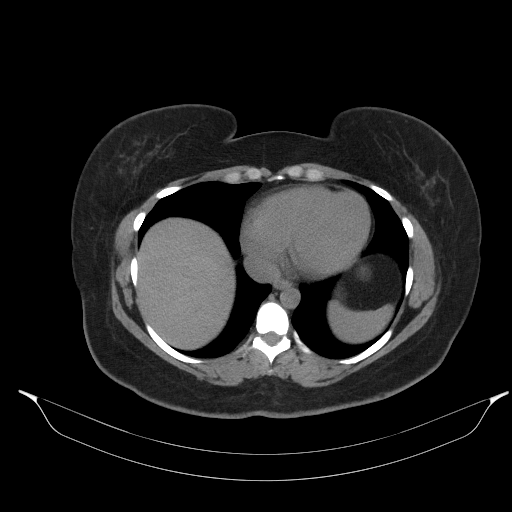
[im 83/88  lung]
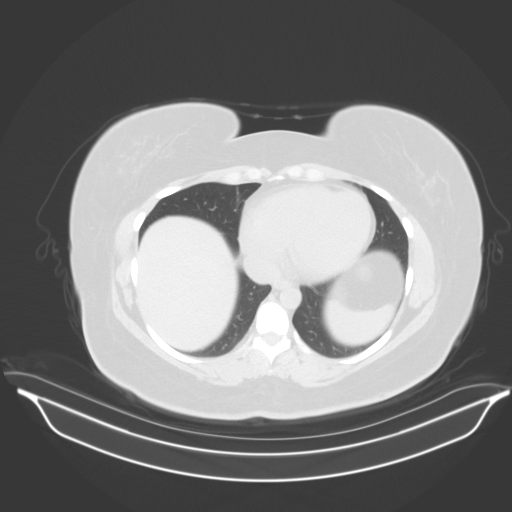

[14 of 32 positions shown; findings below may reference images not displayed]

FINDINGS: Evaluation of this exam is limited in the absence of intravenous
contrast.

Lower chest: The visualized lung bases are clear.

No intra-abdominal free air or free fluid.

Hepatobiliary: No focal liver abnormality is seen. No gallstones,
gallbladder wall thickening, or biliary dilatation.

Pancreas: Unremarkable. No pancreatic ductal dilatation or
surrounding inflammatory changes.

Spleen: Normal in size without focal abnormality.

Adrenals/Urinary Tract: Adrenal glands are unremarkable. Kidneys are
normal, without renal calculi, focal lesion, or hydronephrosis.
Bladder is unremarkable.

Stomach/Bowel: Small scattered sigmoid diverticula. There is no
bowel obstruction or active inflammation. There is moderate colonic
stool burden. The appendix is normal.

Vascular/Lymphatic: The abdominal aorta and IVC are unremarkable on
this noncontrast CT. No portal venous gas. There is no adenopathy.

Reproductive: The uterus is anteverted and grossly unremarkable. The
ovaries are unremarkable as visualized. No pelvic mass.

Other: None

Musculoskeletal: Degenerative changes at L5-S1. No acute osseous
pathology.
IMPRESSION: 1. No acute intra-abdominal or pelvic pathology. No hydronephrosis
or nephrolithiasis.
2. Moderate colonic stool burden. No bowel obstruction or active
inflammation. Normal appendix.
3. Scattered sigmoid diverticula without active inflammatory
changes.

## 2021-12-05 ENCOUNTER — Telehealth: Payer: Medicaid Other | Admitting: Physician Assistant

## 2021-12-05 DIAGNOSIS — B9689 Other specified bacterial agents as the cause of diseases classified elsewhere: Secondary | ICD-10-CM | POA: Diagnosis not present

## 2021-12-05 DIAGNOSIS — J019 Acute sinusitis, unspecified: Secondary | ICD-10-CM | POA: Diagnosis not present

## 2021-12-05 MED ORDER — AMOXICILLIN-POT CLAVULANATE 875-125 MG PO TABS: 1.0000 | ORAL_TABLET | Freq: Two times a day (BID) | ORAL | 0 refills | Status: AC

## 2021-12-05 NOTE — Patient Instructions (Signed)
Robin Arnold, thank you for joining [name], PA-C for today's virtual visit.  While this provider is not your primary care provider (PCP), if your PCP is located in our provider database this encounter information will be shared with them immediately following your visit.  Consent: (Patient) Robin Arnold provided verbal consent for this virtual visit at the beginning of the encounter.  Current Medications:  Current Outpatient Medications:    amoxicillin-clavulanate (AUGMENTIN) 875-125 MG tablet, Take 1 tablet by mouth 2 (two) times daily., Disp: 14 tablet, Rfl: 0   cyclobenzaprine (FLEXERIL) 10 MG tablet, Take 10 mg by mouth at bedtime as needed., Disp: , Rfl:    ibuprofen (ADVIL,MOTRIN) 200 MG tablet, Take 200-800 mg by mouth daily., Disp: , Rfl:    Multiple Vitamin (MULTIVITAMIN) tablet, Take 1 tablet by mouth daily., Disp: , Rfl:    naproxen (NAPROSYN) 500 MG tablet, Take 500 mg by mouth 2 (two) times daily as needed., Disp: , Rfl:    Medications ordered in this encounter:  Meds ordered this encounter  Medications   amoxicillin-clavulanate (AUGMENTIN) 875-125 MG tablet    Sig: Take 1 tablet by mouth 2 (two) times daily.    Dispense:  14 tablet    Refill:  0    Order Specific Question:   Supervising Provider    Answer:   Hyacinth Meeker, BRIAN [[id number]]     *If you need refills on other medications prior to your next appointment, please contact your pharmacy*  Follow-Up: Call back or seek an in-person evaluation if the symptoms worsen or if the condition fails to improve as anticipated.  Other Instructions Sinus Infection, Adult A sinus infection, also called sinusitis, is inflammation of your sinuses. Sinuses are hollow spaces in the bones around your face. Your sinuses are located: Around your eyes. In the middle of your forehead. Behind your nose. In your cheekbones. Mucus normally drains out of your sinuses. When your nasal tissues become inflamed or swollen,  mucus can become trapped or blocked. This allows bacteria, viruses, and fungi to grow, which leads to infection. Most infections of the sinuses are caused by a virus. A sinus infection can develop quickly. It can last for up to 4 weeks (acute) or for more than 12 weeks (chronic). A sinus infection often develops after a cold. What are the causes? This condition is caused by anything that creates swelling in the sinuses or stops mucus from draining. This includes: Allergies. Asthma. Infection from bacteria or viruses. Deformities or blockages in your nose or sinuses. Abnormal growths in the nose (nasal polyps). Pollutants, such as chemicals or irritants in the air. Infection from fungi. This is rare. What increases the risk? You are more likely to develop this condition if you: Have a weak body defense system (immune system). Do a lot of swimming or diving. Overuse nasal sprays. Smoke. What are the signs or symptoms? The main symptoms of this condition are pain and a feeling of pressure around the affected sinuses. Other symptoms include: Stuffy nose or congestion that makes it difficult to breathe through your nose. Thick yellow or greenish drainage from your nose. Tenderness, swelling, and warmth over the affected sinuses. A cough that may get worse at night. Decreased sense of smell and taste. Extra mucus that collects in the throat or the back of the nose (postnasal drip) causing a sore throat or bad breath. Tiredness (fatigue). Fever. How is this diagnosed? This condition is diagnosed based on: Your symptoms. Your medical history.  A physical exam. Tests to find out if your condition is acute or chronic. This may include: Checking your nose for nasal polyps. Viewing your sinuses using a device that has a light (endoscope). Testing for allergies or bacteria. Imaging tests, such as an MRI or CT scan. In rare cases, a bone biopsy may be done to rule out more serious types of  fungal sinus disease. How is this treated? Treatment for a sinus infection depends on the cause and whether your condition is chronic or acute. If caused by a virus, your symptoms should go away on their own within 10 days. You may be given medicines to relieve symptoms. They include: Medicines that shrink swollen nasal passages (decongestants). A spray that eases inflammation of the nostrils (topical intranasal corticosteroids). Rinses that help get rid of thick mucus in your nose (nasal saline washes). Medicines that treat allergies (antihistamines). Over-the-counter pain relievers. If caused by bacteria, your health care provider may recommend waiting to see if your symptoms improve. Most bacterial infections will get better without antibiotic medicine. You may be given antibiotics if you have: A severe infection. A weak immune system. If caused by narrow nasal passages or nasal polyps, surgery may be needed. Follow these instructions at home: Medicines Take, use, or apply over-the-counter and prescription medicines only as told by your health care provider. These may include nasal sprays. If you were prescribed an antibiotic medicine, take it as told by your health care provider. Do not stop taking the antibiotic even if you start to feel better. Hydrate and humidify  Drink enough fluid to keep your urine pale yellow. Staying hydrated will help to thin your mucus. Use a cool mist humidifier to keep the humidity level in your home above 50%. Inhale steam for 10-15 minutes, 3-4 times a day, or as told by your health care provider. You can do this in the bathroom while a hot shower is running. Limit your exposure to cool or dry air. Rest Rest as much as possible. Sleep with your head raised (elevated). Make sure you get enough sleep each night. General instructions  Apply a warm, moist washcloth to your face 3-4 times a day or as told by your health care provider. This will help with  discomfort. Use nasal saline washes as often as told by your health care provider. Wash your hands often with soap and water to reduce your exposure to germs. If soap and water are not available, use hand sanitizer. Do not smoke. Avoid being around people who are smoking (secondhand smoke). Keep all follow-up visits. This is important. Contact a health care provider if: You have a fever. Your symptoms get worse. Your symptoms do not improve within 10 days. Get help right away if: You have a severe headache. You have persistent vomiting. You have severe pain or swelling around your face or eyes. You have vision problems. You develop confusion. Your neck is stiff. You have trouble breathing. These symptoms may be an emergency. Get help right away. Call 911. Do not wait to see if the symptoms will go away. Do not drive yourself to the hospital. Summary A sinus infection is soreness and inflammation of your sinuses. Sinuses are hollow spaces in the bones around your face. This condition is caused by nasal tissues that become inflamed or swollen. The swelling traps or blocks the flow of mucus. This allows bacteria, viruses, and fungi to grow, which leads to infection. If you were prescribed an antibiotic medicine, take it as  told by your health care provider. Do not stop taking the antibiotic even if you start to feel better. Keep all follow-up visits. This is important. This information is not intended to replace advice given to you by your health care provider. Make sure you discuss any questions you have with your health care provider. Document Revised: 03/02/2021 Document Reviewed: 03/02/2021 Elsevier Patient Education  2023 [employer]    If you have been instructed to have an in-person evaluation today at a local Urgent Care facility, please use the link below. It will take you to a list of all of our available [facility], including address, phone number and hours of  operation. Please do not delay care.  [facility]  If you or a family member do not have a primary care provider, use the link below to schedule a visit and establish care. When you choose a [facility] primary care physician or advanced practice provider, you gain a long-term partner in health. Find a Primary Care Provider  Learn more about [facility] in-office and virtual care options: [facility] - Get Care Now

## 2021-12-05 NOTE — Progress Notes (Signed)
Virtual Visit Consent   Robin Arnold, you are scheduled for a virtual visit with a [facility] provider today. Just as with appointments in the office, your consent must be obtained to participate. Your consent will be active for this visit and any virtual visit you may have with one of our providers in the next 365 days. If you have a MyChart account, a copy of this consent can be sent to you electronically.  As this is a virtual visit, video technology does not allow for your provider to perform a traditional examination. This may limit your provider's ability to fully assess your condition. If your provider identifies any concerns that need to be evaluated in person or the need to arrange testing (such as labs, EKG, etc.), we will make arrangements to do so. Although advances in technology are sophisticated, we cannot ensure that it will always work on either your end or our end. If the connection with a video visit is poor, the visit may have to be switched to a telephone visit. With either a video or telephone visit, we are not always able to ensure that we have a secure connection.  By engaging in this virtual visit, you consent to the provision of healthcare and authorize for your insurance to be billed (if applicable) for the services provided during this visit. Depending on your insurance coverage, you may receive a charge related to this service.  I need to obtain your verbal consent now. Are you willing to proceed with your visit today? Robin Arnold has provided verbal consent on 12/05/2021 for a virtual visit (video or telephone). Robin Loveless, PA-C  Date: 12/05/2021 1:05 PM  Virtual Visit via Video Note   IMargaretann Arnold, connected with  Robin Arnold  ([id number], 01/09/1976) on 12/05/21 at  1:00 PM EDT by a video-enabled telemedicine application and verified that I am speaking with the correct person using two identifiers.  Location: Patient: Virtual Visit  Location Patient: Home Provider: Virtual Visit Location Provider: Home Office   I discussed the limitations of evaluation and management by telemedicine and the availability of in person appointments. The patient expressed understanding and agreed to proceed.    History of Present Illness: Robin Arnold is a 46 y.o. who identifies as a female who was assigned female at birth, and is being seen today for possible sinus infection.  HPI: Sinusitis This is a new problem. The current episode started in the past 7 days. The problem has been gradually worsening since onset. Maximum temperature: low grade, 98.4 today; has been just over 100. Associated symptoms include chills (occasionally), congestion, coughing (morning only), ear pain, headaches, sinus pressure and a sore throat. Pertinent negatives include no hoarse voice. (Post nasal drainage) Treatments tried: ibuprofen. The treatment provided mild relief.      Problems:  Patient Active Problem List   Diagnosis Date Noted   Vitamin B1 deficiency 08/13/2017   Multiple neurological symptoms 07/11/2017    Allergies:  Allergies  Allergen Reactions   Allegra-D [Fexofenadine-Pseudoephed Er]     12 hr med   Sulfa Antibiotics    Vicodin [Hydrocodone-Acetaminophen] Itching    "crawling skin sensation"   Codeine Palpitations   Septra Ds [Sulfamethoxazole-Trimethoprim] Rash   Medications:  Current Outpatient Medications:    amoxicillin-clavulanate (AUGMENTIN) 875-125 MG tablet, Take 1 tablet by mouth 2 (two) times daily., Disp: 14 tablet, Rfl: 0   cyclobenzaprine (FLEXERIL) 10 MG tablet, Take 10 mg by mouth at bedtime  as needed., Disp: , Rfl:    ibuprofen (ADVIL,MOTRIN) 200 MG tablet, Take 200-800 mg by mouth daily., Disp: , Rfl:    Multiple Vitamin (MULTIVITAMIN) tablet, Take 1 tablet by mouth daily., Disp: , Rfl:    naproxen (NAPROSYN) 500 MG tablet, Take 500 mg by mouth 2 (two) times daily as needed., Disp: , Rfl:    Observations/Objective: Patient is well-developed, well-nourished in no acute distress.  Resting comfortably at home.  Head is normocephalic, atraumatic.  No labored breathing.  Speech is clear and coherent with logical content.  Patient is alert and oriented at baseline.    Assessment and Plan: 1. Acute bacterial sinusitis - amoxicillin-clavulanate (AUGMENTIN) 875-125 MG tablet; Take 1 tablet by mouth 2 (two) times daily.  Dispense: 14 tablet; Refill: 0  - Worsening symptoms that have not responded to OTC medications.  - Will give Augmentin - Continue allergy medications.  - Steam and humidifier can help - Stay well hydrated and get plenty of rest.  - Seek in person evaluation if no symptom improvement or if symptoms worsen   Follow Up Instructions: I discussed the assessment and treatment plan with the patient. The patient was provided an opportunity to ask questions and all were answered. The patient agreed with the plan and demonstrated an understanding of the instructions.  A copy of instructions were sent to the patient via MyChart unless otherwise noted below.    The patient was advised to call back or seek an in-person evaluation if the symptoms worsen or if the condition fails to improve as anticipated.  Time:  I spent 8 minutes with the patient via telehealth technology discussing the above problems/concerns.    Robin Loveless, PA-C

## 2023-12-25 ENCOUNTER — Telehealth

## 2023-12-28 ENCOUNTER — Ambulatory Visit (HOSPITAL_COMMUNITY)
